# Patient Record
Sex: Female | Born: 1960 | Race: White | Hispanic: No | Marital: Married | Smoking: Never smoker
Health system: Southern US, Community
[De-identification: ages and names within clinical notes are randomized; demographics above are authoritative.]

## PROBLEM LIST (undated history)

## (undated) DIAGNOSIS — R000 Tachycardia, unspecified: Secondary | ICD-10-CM

## (undated) DIAGNOSIS — T4145XA Adverse effect of unspecified anesthetic, initial encounter: Secondary | ICD-10-CM

## (undated) DIAGNOSIS — E785 Hyperlipidemia, unspecified: Secondary | ICD-10-CM

## (undated) DIAGNOSIS — Z8489 Family history of other specified conditions: Secondary | ICD-10-CM

## (undated) DIAGNOSIS — H8109 Meniere's disease, unspecified ear: Secondary | ICD-10-CM

## (undated) DIAGNOSIS — K219 Gastro-esophageal reflux disease without esophagitis: Secondary | ICD-10-CM

## (undated) DIAGNOSIS — R112 Nausea with vomiting, unspecified: Secondary | ICD-10-CM

## (undated) DIAGNOSIS — G43909 Migraine, unspecified, not intractable, without status migrainosus: Secondary | ICD-10-CM

## (undated) DIAGNOSIS — C50919 Malignant neoplasm of unspecified site of unspecified female breast: Secondary | ICD-10-CM

## (undated) DIAGNOSIS — D649 Anemia, unspecified: Secondary | ICD-10-CM

## (undated) DIAGNOSIS — J45909 Unspecified asthma, uncomplicated: Secondary | ICD-10-CM

## (undated) DIAGNOSIS — T8859XA Other complications of anesthesia, initial encounter: Secondary | ICD-10-CM

## (undated) DIAGNOSIS — Z9889 Other specified postprocedural states: Secondary | ICD-10-CM

## (undated) HISTORY — PX: COLONOSCOPY: SHX174

## (undated) HISTORY — DX: Hyperlipidemia, unspecified: E78.5

## (undated) HISTORY — PX: COLPOSCOPY: SHX161

---
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
## 2010-07-16 ENCOUNTER — Emergency Department (HOSPITAL_BASED_OUTPATIENT_CLINIC_OR_DEPARTMENT_OTHER)
Admission: EM | Admit: 2010-07-16 | Discharge: 2010-07-16 | Disposition: A | Discharge status: Home / Self Care | Payer: 59 | Attending: Emergency Medicine | Admitting: Emergency Medicine

## 2010-07-16 DIAGNOSIS — M549 Dorsalgia, unspecified: Secondary | ICD-10-CM | POA: Insufficient documentation

## 2010-07-16 DIAGNOSIS — K219 Gastro-esophageal reflux disease without esophagitis: Secondary | ICD-10-CM | POA: Insufficient documentation

## 2018-01-27 ENCOUNTER — Other Ambulatory Visit: Payer: Self-pay | Admitting: Obstetrics and Gynecology

## 2018-01-27 DIAGNOSIS — R921 Mammographic calcification found on diagnostic imaging of breast: Secondary | ICD-10-CM

## 2018-02-03 ENCOUNTER — Ambulatory Visit
Admission: RE | Admit: 2018-02-03 | Discharge: 2018-02-03 | Disposition: A | Discharge status: Home / Self Care | Payer: 59 | Source: Ambulatory Visit | Attending: Obstetrics and Gynecology | Admitting: Obstetrics and Gynecology

## 2018-02-03 DIAGNOSIS — R921 Mammographic calcification found on diagnostic imaging of breast: Secondary | ICD-10-CM

## 2018-02-03 HISTORY — PX: BREAST BIOPSY: SHX20

## 2018-02-17 DIAGNOSIS — C50412 Malignant neoplasm of upper-outer quadrant of left female breast: Secondary | ICD-10-CM | POA: Insufficient documentation

## 2018-02-18 ENCOUNTER — Other Ambulatory Visit: Payer: Self-pay | Admitting: General Surgery

## 2018-02-18 DIAGNOSIS — D0512 Intraductal carcinoma in situ of left breast: Secondary | ICD-10-CM

## 2018-02-19 ENCOUNTER — Other Ambulatory Visit: Payer: Self-pay | Admitting: General Surgery

## 2018-02-19 DIAGNOSIS — D0512 Intraductal carcinoma in situ of left breast: Secondary | ICD-10-CM

## 2018-03-14 ENCOUNTER — Other Ambulatory Visit: Payer: Self-pay | Admitting: General Surgery

## 2018-03-14 DIAGNOSIS — C50011 Malignant neoplasm of nipple and areola, right female breast: Secondary | ICD-10-CM

## 2018-03-21 NOTE — Pre-Procedure Instructions (Signed)
Leylany Nored  03/21/2018      [id number] - [place], [place] - [address] AT [place] [address] Phone: [phone] Fax: [phone]    Your procedure is scheduled on Mon., Jan. 6, 2020 from 10:00AM-11:00AM  Report to [facility] Admitting Entrance "A" at 8:00AM  Call this number if you have problems the morning of surgery:  [phone]   Remember:  Do not eat after midnight on Jan. 5th  You may drink clear liquids until 3 hours (7:00AM) prior to surgery.  Clear liquids allowed are:  Water, Juice (non-citric and without pulp), Carbonated beverages, Clear Tea, Black Coffee only, Plain Jell-O only, Gatorade and Plain Popsicles only   Please complete your PRE-SURGERY ENSURE that was provided to you by (7:00AM) the morning of surgery.  Please, if able, drink it in one setting. DO NOT SIP.     Take these medicines the morning of surgery with A SIP OF WATER: Famotidine (PEPCID)  If needed: Loratadine (CLARITIN), Systane eye drops, and Ocean nasal spray  As of today, stop taking all Other Aspirin Products, Vitamins, Fish oils, and Herbal medications. Also stop all NSAIDS i.e. Advil, Ibuprofen, Motrin, Aleve, Anaprox, Naproxen, BC, Goody Powders, and all Supplements. Including: Chlorphen-Phenyleph-ASA (ALKA-SELTZER PLUS COLD    Do not wear jewelry, make-up or nail polish.  Do not wear lotions, powders, or perfumes, or deodorant.  Do not shave 48 hours prior to surgery.    Do not bring valuables to the hospital.  [facility] is not responsible for any belongings or valuables.  Contacts, dentures or bridgework may not be worn into surgery.  Leave your suitcase in the car.  After surgery it may be brought to your room.  For patients admitted to the hospital, discharge time will be determined by your treatment team.  Patients discharged the day of surgery will not be allowed to drive home.   Special  instructions:  [place]- Preparing For Surgery  Before surgery, you can play an important role. Because skin is not sterile, your skin needs to be as free of germs as possible. You can reduce the number of germs on your skin by washing with CHG (chlorahexidine gluconate) Soap before surgery.  CHG is an antiseptic cleaner which kills germs and bonds with the skin to continue killing germs even after washing.    Oral Hygiene is also important to reduce your risk of infection.  Remember - BRUSH YOUR TEETH THE MORNING OF SURGERY WITH YOUR REGULAR TOOTHPASTE  Please do not use if you have an allergy to CHG or antibacterial soaps. If your skin becomes reddened/irritated stop using the CHG.  Do not shave (including legs and underarms) for at least 48 hours prior to first CHG shower. It is OK to shave your face.  Please follow these instructions carefully.   1. Shower the NIGHT BEFORE SURGERY and the MORNING OF SURGERY with CHG.   2. If you chose to wash your hair, wash your hair first as usual with your normal shampoo.  3. After you shampoo, rinse your hair and body thoroughly to remove the shampoo.  4. Use CHG as you would any other liquid soap. You can apply CHG directly to the skin and wash gently with a scrungie or a clean washcloth.   5. Apply the CHG Soap to your body ONLY FROM THE NECK DOWN.  Do not use on open wounds or open sores.  Avoid contact with your eyes, ears, mouth and genitals (private parts). Wash Face and genitals (private parts)  with your normal soap.  6. Wash thoroughly, paying special attention to the area where your surgery will be performed.  7. Thoroughly rinse your body with warm water from the neck down.  8. DO NOT shower/wash with your normal soap after using and rinsing off the CHG Soap.  9. Pat yourself dry with a CLEAN TOWEL.  10. Wear CLEAN PAJAMAS to bed the night before surgery, wear comfortable clothes the morning of surgery  11. Place CLEAN SHEETS on  your bed the night of your first shower and DO NOT SLEEP WITH PETS.  Day of Surgery:  Do not apply any deodorants/lotions.  Please wear clean clothes to the hospital/surgery center.   Remember to brush your teeth WITH YOUR REGULAR TOOTHPASTE.  Please read over the following fact sheets that you were given. Pain Booklet, Coughing and Deep Breathing and Surgical Site Infection Prevention

## 2018-03-24 ENCOUNTER — Encounter (HOSPITAL_COMMUNITY): Payer: Self-pay

## 2018-03-24 ENCOUNTER — Encounter (HOSPITAL_COMMUNITY)
Admission: RE | Admit: 2018-03-24 | Discharge: 2018-03-24 | Disposition: A | Discharge status: Home / Self Care | Payer: 59 | Source: Ambulatory Visit | Attending: General Surgery | Admitting: General Surgery

## 2018-03-24 ENCOUNTER — Other Ambulatory Visit: Payer: Self-pay

## 2018-03-24 DIAGNOSIS — Z01812 Encounter for preprocedural laboratory examination: Secondary | ICD-10-CM | POA: Insufficient documentation

## 2018-03-24 HISTORY — DX: Other specified postprocedural states: Z98.890

## 2018-03-24 HISTORY — DX: Malignant neoplasm of unspecified site of unspecified female breast: C50.919

## 2018-03-24 HISTORY — DX: Anemia, unspecified: D64.9

## 2018-03-24 HISTORY — DX: Tachycardia, unspecified: R00.0

## 2018-03-24 HISTORY — DX: Gastro-esophageal reflux disease without esophagitis: K21.9

## 2018-03-24 HISTORY — DX: Migraine, unspecified, not intractable, without status migrainosus: G43.909

## 2018-03-24 HISTORY — DX: Nausea with vomiting, unspecified: R11.2

## 2018-03-24 HISTORY — DX: Adverse effect of unspecified anesthetic, initial encounter: T41.45XA

## 2018-03-24 HISTORY — DX: Other complications of anesthesia, initial encounter: T88.59XA

## 2018-03-24 HISTORY — DX: Unspecified asthma, uncomplicated: J45.909

## 2018-03-24 HISTORY — DX: Meniere's disease, unspecified ear: H81.09

## 2018-03-24 HISTORY — DX: Family history of other specified conditions: Z84.89

## 2018-03-24 LAB — BASIC METABOLIC PANEL
Anion gap: 12 (ref 5–15)
BUN: 12 mg/dL (ref 6–20)
CO2: 27 mmol/L (ref 22–32)
Calcium: 9.6 mg/dL (ref 8.9–10.3)
Chloride: 97 mmol/L — ABNORMAL LOW (ref 98–111)
Creatinine, Ser: 0.71 mg/dL (ref 0.44–1.00)
GFR calc Af Amer: >60 mL/min (ref >60)
GFR calc non Af Amer: >60 mL/min (ref >60)
Glucose, Bld: 69 mg/dL — ABNORMAL LOW (ref 70–99)
Potassium: 3.4 mmol/L — ABNORMAL LOW (ref 3.5–5.1)
SODIUM: 136 mmol/L (ref 135–145)

## 2018-03-24 LAB — CBC
HCT: 41.3 % (ref 36.0–46.0)
Hemoglobin: 13.2 g/dL (ref 12.0–15.0)
MCH: 28.9 pg (ref 26.0–34.0)
MCHC: 32 g/dL (ref 30.0–36.0)
MCV: 90.4 fL (ref 80.0–100.0)
NRBC: 0 % (ref 0.0–0.2)
Platelets: 306 10*3/uL (ref 150–400)
RBC: 4.57 MIL/uL (ref 3.87–5.11)
RDW: 12.8 % (ref 11.5–15.5)
WBC: 8.5 10*3/uL (ref 4.0–10.5)

## 2018-03-24 NOTE — Progress Notes (Addendum)
PCP/GYN - Dr. Shelly Flatten  Cardiologist - Denies  Chest x-ray - Denies  EKG - Denies  Stress Test -Denies   ECHO - Denies  Cardiac Cath - Denies  AICD- na PM- na LOOP- na  Sleep Study - Denies CPAP - None  LABS- 03/24/18: CBC, BMP 03/31/18: PT, per note on chart  ASA- Denies   Anesthesia- NoJeneen Rinks, PA for anesthesia services came to speak with the pt at the PAT appointment, due to pt having questions about anesthesia and alzheimer's correlation, since it runs in her family.  Pt denies having chest pain, sob, or fever at this time. All instructions explained to the pt, with a verbal understanding of the material. Pt agrees to go over the instructions while at home for a better understanding. The opportunity to ask questions was provided.

## 2018-03-28 ENCOUNTER — Ambulatory Visit
Admission: RE | Admit: 2018-03-28 | Discharge: 2018-03-28 | Disposition: A | Discharge status: Home / Self Care | Payer: 59 | Source: Ambulatory Visit | Attending: General Surgery | Admitting: General Surgery

## 2018-03-28 DIAGNOSIS — D0512 Intraductal carcinoma in situ of left breast: Secondary | ICD-10-CM

## 2018-03-31 ENCOUNTER — Ambulatory Visit (HOSPITAL_COMMUNITY): Payer: 59 | Admitting: Physician Assistant

## 2018-03-31 ENCOUNTER — Other Ambulatory Visit: Payer: Self-pay

## 2018-03-31 ENCOUNTER — Encounter (HOSPITAL_COMMUNITY)
Admission: RE | Disposition: A | Discharge status: Home / Self Care | Payer: Self-pay | Source: Home / Self Care | Attending: General Surgery

## 2018-03-31 ENCOUNTER — Ambulatory Visit (HOSPITAL_COMMUNITY)
Admission: RE | Admit: 2018-03-31 | Discharge: 2018-03-31 | Disposition: A | Discharge status: Home / Self Care | Payer: 59 | Attending: General Surgery | Admitting: General Surgery

## 2018-03-31 ENCOUNTER — Ambulatory Visit (HOSPITAL_COMMUNITY): Payer: 59 | Admitting: Certified Registered Nurse Anesthetist

## 2018-03-31 ENCOUNTER — Encounter (HOSPITAL_COMMUNITY): Payer: Self-pay | Admitting: *Deleted

## 2018-03-31 ENCOUNTER — Ambulatory Visit
Admission: RE | Admit: 2018-03-31 | Discharge: 2018-03-31 | Disposition: A | Discharge status: Home / Self Care | Payer: 59 | Source: Ambulatory Visit | Attending: General Surgery | Admitting: General Surgery

## 2018-03-31 DIAGNOSIS — D0592 Unspecified type of carcinoma in situ of left breast: Secondary | ICD-10-CM | POA: Diagnosis not present

## 2018-03-31 DIAGNOSIS — D0512 Intraductal carcinoma in situ of left breast: Secondary | ICD-10-CM

## 2018-03-31 DIAGNOSIS — Z170 Estrogen receptor positive status [ER+]: Secondary | ICD-10-CM | POA: Insufficient documentation

## 2018-03-31 DIAGNOSIS — Z79899 Other long term (current) drug therapy: Secondary | ICD-10-CM | POA: Diagnosis not present

## 2018-03-31 DIAGNOSIS — K219 Gastro-esophageal reflux disease without esophagitis: Secondary | ICD-10-CM | POA: Diagnosis not present

## 2018-03-31 HISTORY — PX: BREAST LUMPECTOMY: SHX2

## 2018-03-31 HISTORY — PX: BREAST LUMPECTOMY WITH RADIOACTIVE SEED LOCALIZATION: SHX6424

## 2018-03-31 LAB — PROTIME-INR
INR: 1
Prothrombin Time: 13.1 seconds (ref 11.4–15.2)

## 2018-03-31 SURGERY — BREAST LUMPECTOMY WITH RADIOACTIVE SEED LOCALIZATION
Anesthesia: General | Site: Breast | Laterality: Left

## 2018-03-31 MED ORDER — OXYCODONE HCL 5 MG/5ML PO SOLN: 5.0000 mg | Freq: Once | ORAL | Status: DC | PRN

## 2018-03-31 MED ORDER — OXYCODONE HCL 5 MG PO TABS: 5.0000 mg | ORAL_TABLET | ORAL | Status: DC | PRN

## 2018-03-31 MED ORDER — PROPOFOL 500 MG/50ML IV EMUL
INTRAVENOUS | Status: DC | PRN
  Administered 2018-03-31: 125 ug/kg/min via INTRAVENOUS

## 2018-03-31 MED ORDER — SODIUM CHLORIDE 0.9 % IV SOLN
INTRAVENOUS | Status: DC | PRN
  Administered 2018-03-31: 25 ug/min via INTRAVENOUS

## 2018-03-31 MED ORDER — METHYLENE BLUE 0.5 % INJ SOLN
INTRAVENOUS | Status: AC
  Filled 2018-03-31: qty 10

## 2018-03-31 MED ORDER — SODIUM CHLORIDE 0.9 % IV SOLN: INTRAVENOUS | Status: DC

## 2018-03-31 MED ORDER — LIDOCAINE 2% (20 MG/ML) 5 ML SYRINGE
INTRAMUSCULAR | Status: DC | PRN
  Administered 2018-03-31: 80 mg via INTRAVENOUS

## 2018-03-31 MED ORDER — PROPOFOL 10 MG/ML IV BOLUS
INTRAVENOUS | Status: DC | PRN
  Administered 2018-03-31: 160 mg via INTRAVENOUS

## 2018-03-31 MED ORDER — 0.9 % SODIUM CHLORIDE (POUR BTL) OPTIME
TOPICAL | Status: DC | PRN
  Administered 2018-03-31: 1000 mL

## 2018-03-31 MED ORDER — TRAMADOL HCL 50 MG PO TABS: 50.0000 mg | ORAL_TABLET | Freq: Four times a day (QID) | ORAL | 0 refills | Status: DC | PRN

## 2018-03-31 MED ORDER — LACTATED RINGERS IV SOLN
INTRAVENOUS | Status: DC
  Administered 2018-03-31: 10:00:00 via INTRAVENOUS

## 2018-03-31 MED ORDER — GABAPENTIN 100 MG PO CAPS
100.0000 mg | ORAL_CAPSULE | ORAL | Status: AC
  Administered 2018-03-31: 100 mg via ORAL
  Filled 2018-03-31: qty 1

## 2018-03-31 MED ORDER — SODIUM CHLORIDE (PF) 0.9 % IJ SOLN
INTRAMUSCULAR | Status: AC
  Filled 2018-03-31: qty 10

## 2018-03-31 MED ORDER — ACETAMINOPHEN 500 MG PO TABS: 1000.0000 mg | ORAL_TABLET | Freq: Once | ORAL | Status: DC | PRN

## 2018-03-31 MED ORDER — CEFAZOLIN SODIUM-DEXTROSE 2-4 GM/100ML-% IV SOLN
2.0000 g | INTRAVENOUS | Status: AC
  Administered 2018-03-31: 2 g via INTRAVENOUS
  Filled 2018-03-31: qty 100

## 2018-03-31 MED ORDER — BUPIVACAINE-EPINEPHRINE 0.25% -1:200000 IJ SOLN
INTRAMUSCULAR | Status: DC | PRN
  Administered 2018-03-31: 20 mL

## 2018-03-31 MED ORDER — FENTANYL CITRATE (PF) 100 MCG/2ML IJ SOLN
INTRAMUSCULAR | Status: DC | PRN
  Administered 2018-03-31 (×3): 50 ug via INTRAVENOUS

## 2018-03-31 MED ORDER — ONDANSETRON HCL 4 MG/2ML IJ SOLN
INTRAMUSCULAR | Status: DC | PRN
  Administered 2018-03-31: 4 mg via INTRAVENOUS

## 2018-03-31 MED ORDER — KETOROLAC TROMETHAMINE 15 MG/ML IJ SOLN
15.0000 mg | INTRAMUSCULAR | Status: AC
  Administered 2018-03-31: 15 mg via INTRAVENOUS
  Filled 2018-03-31: qty 1

## 2018-03-31 MED ORDER — PHENYLEPHRINE 40 MCG/ML (10ML) SYRINGE FOR IV PUSH (FOR BLOOD PRESSURE SUPPORT)
PREFILLED_SYRINGE | INTRAVENOUS | Status: DC | PRN
  Administered 2018-03-31: 120 ug via INTRAVENOUS
  Administered 2018-03-31: 80 ug via INTRAVENOUS

## 2018-03-31 MED ORDER — ACETAMINOPHEN 500 MG PO TABS
1000.0000 mg | ORAL_TABLET | ORAL | Status: AC
  Administered 2018-03-31: 1000 mg via ORAL
  Filled 2018-03-31: qty 2

## 2018-03-31 MED ORDER — MIDAZOLAM HCL 5 MG/5ML IJ SOLN
INTRAMUSCULAR | Status: DC | PRN
  Administered 2018-03-31: 2 mg via INTRAVENOUS

## 2018-03-31 MED ORDER — OXYCODONE HCL 5 MG PO TABS: 5.0000 mg | ORAL_TABLET | Freq: Once | ORAL | Status: DC | PRN

## 2018-03-31 MED ORDER — ACETAMINOPHEN 160 MG/5ML PO SOLN: 1000.0000 mg | Freq: Once | ORAL | Status: DC | PRN

## 2018-03-31 MED ORDER — GABAPENTIN 300 MG PO CAPS
ORAL_CAPSULE | ORAL | Status: AC
  Filled 2018-03-31: qty 1

## 2018-03-31 MED ORDER — ENSURE PRE-SURGERY PO LIQD: 296.0000 mL | Freq: Once | ORAL | Status: DC

## 2018-03-31 MED ORDER — FENTANYL CITRATE (PF) 100 MCG/2ML IJ SOLN: 25.0000 ug | INTRAMUSCULAR | Status: DC | PRN

## 2018-03-31 MED ORDER — ACETAMINOPHEN 10 MG/ML IV SOLN: 1000.0000 mg | Freq: Once | INTRAVENOUS | Status: DC | PRN

## 2018-03-31 MED ORDER — BUPIVACAINE-EPINEPHRINE (PF) 0.25% -1:200000 IJ SOLN
INTRAMUSCULAR | Status: AC
  Filled 2018-03-31: qty 30

## 2018-03-31 MED ORDER — DEXAMETHASONE SODIUM PHOSPHATE 10 MG/ML IJ SOLN
INTRAMUSCULAR | Status: DC | PRN
  Administered 2018-03-31: 10 mg via INTRAVENOUS

## 2018-03-31 MED ORDER — MIDAZOLAM HCL 2 MG/2ML IJ SOLN
INTRAMUSCULAR | Status: AC
  Filled 2018-03-31: qty 2

## 2018-03-31 MED ORDER — FENTANYL CITRATE (PF) 250 MCG/5ML IJ SOLN
INTRAMUSCULAR | Status: AC
  Filled 2018-03-31: qty 5

## 2018-03-31 SURGICAL SUPPLY — 54 items
ADH SKN CLS APL DERMABOND .7 (GAUZE/BANDAGES/DRESSINGS) ×1
APPLIER CLIP 9.375 MED OPEN (MISCELLANEOUS) ×3
APR CLP MED 9.3 20 MLT OPN (MISCELLANEOUS) ×1
BINDER BREAST LRG (GAUZE/BANDAGES/DRESSINGS) IMPLANT
BINDER BREAST XLRG (GAUZE/BANDAGES/DRESSINGS) IMPLANT
BLADE SURG 15 STRL LF DISP TIS (BLADE) ×1 IMPLANT
BLADE SURG 15 STRL SS (BLADE) ×3
CANISTER SUCT 3000ML PPV (MISCELLANEOUS) ×3 IMPLANT
CHLORAPREP W/TINT 26ML (MISCELLANEOUS) ×3 IMPLANT
CLIP APPLIE 9.375 MED OPEN (MISCELLANEOUS) IMPLANT
CLIP VESOCCLUDE MED 6/CT (CLIP) ×1 IMPLANT
CLOSURE STERI-STRIP 1/2X4 (GAUZE/BANDAGES/DRESSINGS) ×1
CLOSURE WOUND 1/2 X4 (GAUZE/BANDAGES/DRESSINGS) ×1
CLSR STERI-STRIP ANTIMIC 1/2X4 (GAUZE/BANDAGES/DRESSINGS) ×1 IMPLANT
COVER PROBE W GEL 5X96 (DRAPES) ×3 IMPLANT
COVER SURGICAL LIGHT HANDLE (MISCELLANEOUS) ×3 IMPLANT
COVER WAND RF STERILE (DRAPES) ×3 IMPLANT
DERMABOND ADVANCED (GAUZE/BANDAGES/DRESSINGS) ×2
DERMABOND ADVANCED .7 DNX12 (GAUZE/BANDAGES/DRESSINGS) ×1 IMPLANT
DEVICE DUBIN SPECIMEN MAMMOGRA (MISCELLANEOUS) ×3 IMPLANT
DRAPE CHEST BREAST 15X10 FENES (DRAPES) ×3 IMPLANT
DRAPE UTILITY XL STRL (DRAPES) ×3 IMPLANT
ELECT COATED BLADE 2.86 ST (ELECTRODE) ×3 IMPLANT
ELECT REM PT RETURN 9FT ADLT (ELECTROSURGICAL) ×3
ELECTRODE REM PT RTRN 9FT ADLT (ELECTROSURGICAL) ×1 IMPLANT
GLOVE BIO SURGEON STRL SZ7 (GLOVE) ×6 IMPLANT
GLOVE BIOGEL PI IND STRL 7.5 (GLOVE) ×1 IMPLANT
GLOVE BIOGEL PI INDICATOR 7.5 (GLOVE) ×2
GOWN STRL REUS W/ TWL LRG LVL3 (GOWN DISPOSABLE) ×2 IMPLANT
GOWN STRL REUS W/TWL LRG LVL3 (GOWN DISPOSABLE) ×6
KIT BASIN OR (CUSTOM PROCEDURE TRAY) ×3 IMPLANT
KIT MARKER MARGIN INK (KITS) ×3 IMPLANT
LIGHT WAVEGUIDE WIDE FLAT (MISCELLANEOUS) IMPLANT
NDL HYPO 25GX1X1/2 BEV (NEEDLE) ×1 IMPLANT
NEEDLE HYPO 25GX1X1/2 BEV (NEEDLE) ×3 IMPLANT
NS IRRIG 1000ML POUR BTL (IV SOLUTION) ×3 IMPLANT
PACK SURGICAL SETUP 50X90 (CUSTOM PROCEDURE TRAY) ×3 IMPLANT
PENCIL BUTTON HOLSTER BLD 10FT (ELECTRODE) ×3 IMPLANT
SPONGE LAP 18X18 X RAY DECT (DISPOSABLE) ×3 IMPLANT
STRIP CLOSURE SKIN 1/2X4 (GAUZE/BANDAGES/DRESSINGS) ×2 IMPLANT
SUT MNCRL AB 4-0 PS2 18 (SUTURE) ×3 IMPLANT
SUT MON AB 5-0 PS2 18 (SUTURE) ×2 IMPLANT
SUT SILK 2 0 SH (SUTURE) IMPLANT
SUT VIC AB 2-0 SH 27 (SUTURE) ×3
SUT VIC AB 2-0 SH 27XBRD (SUTURE) ×1 IMPLANT
SUT VIC AB 3-0 SH 27 (SUTURE) ×3
SUT VIC AB 3-0 SH 27X BRD (SUTURE) ×1 IMPLANT
SYR BULB 3OZ (MISCELLANEOUS) ×3 IMPLANT
SYR CONTROL 10ML LL (SYRINGE) ×3 IMPLANT
TOWEL OR 17X24 6PK STRL BLUE (TOWEL DISPOSABLE) ×3 IMPLANT
TOWEL OR 17X26 10 PK STRL BLUE (TOWEL DISPOSABLE) ×3 IMPLANT
TUBE CONNECTING 12'X1/4 (SUCTIONS) ×1
TUBE CONNECTING 12X1/4 (SUCTIONS) ×2 IMPLANT
YANKAUER SUCT BULB TIP NO VENT (SUCTIONS) ×3 IMPLANT

## 2018-03-31 NOTE — H&P (Signed)
72 yof referred by Dr Shelly Flatten for breast cancer. she has no fh and no prior history. she had no mass or dc. she had screening mm that shows b density breasts. the right is negative. the left side has a mass with calcifications. callback shows a 1.8x2.1x0.7 cm area of calcificiations. US shows a 1 cm area. [place] axilla is negative. 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 she is here with her sister and daughter to discuss options   Past Surgical History  Breast Biopsy  Left. Cesarean Section - 1  Tonsillectomy   Diagnostic Studies History Colonoscopy  5-10 years ago Mammogram  within last year Pap Smear  1-5 years ago  Allergies  Shellfish  Nausea, Vomiting. Allergies Reconciled   Medication History  Chlorthalidone (25MG  Tablet, Oral) Active. Potassium Chloride ER (10MEQ Capsule ER, Oral) Active. Nitrofurantoin Monohyd Macro (100MG  Capsule, Oral) Active. Klor-Con M10 (10MEQ Tablet ER, Oral) Active. Cefuroxime Axetil (250MG  Tablet, Oral) Active. Allegra Allergy (60MG  Tablet, Oral) Active. Medications Reconciled  Social History Alcohol use  Occasional alcohol use. Caffeine use  Coffee, Tea. No drug use  Tobacco use  Never smoker.  Family History  Alcohol Abuse  Daughter. Depression  Brother, Mother, Sister. Heart Disease  Father, Mother. Hypertension  Mother. Thyroid problems  Mother, Sister. SVT (SUPRAVENTRICULAR TACHYCARDIA) (I47.1)  Daughter.  Pregnancy / Birth History Age at menarche  13 years. Age of menopause  2-60 Contraceptive History  Oral contraceptives. Gravida  2 Irregular periods  Length (months) of breastfeeding  7-12 Maternal age  46-25 Para  2  Other Problems  Back Pain  Gastroesophageal Reflux Disease  Lump In Breast  Migraine Headache  Other disease, cancer, significant illness   Review of Systems General Not Present- Appetite Loss, Chills, Fatigue, Fever, Night Sweats, Weight Gain and  Weight Loss. Skin Present- Dryness. Not Present- Change in Wart/Mole, Hives, Jaundice, New Lesions, Non-Healing Wounds, Rash and Ulcer. HEENT Present- Ringing in the Ears, Sore Throat and Wears glasses/contact lenses. Not Present- Earache, Hearing Loss, Hoarseness, Nose Bleed, Oral Ulcers, Seasonal Allergies, Sinus Pain, Visual Disturbances and Yellow Eyes. Respiratory Not Present- Bloody sputum, Chronic Cough, Difficulty Breathing, Snoring and Wheezing. Breast Present- Breast Mass. Not Present- Breast Pain, Nipple Discharge and Skin Changes. Cardiovascular Not Present- Chest Pain, Difficulty Breathing Lying Down, Leg Cramps, Palpitations, Rapid Heart Rate, Shortness of Breath and Swelling of Extremities. Gastrointestinal Not Present- Abdominal Pain, Bloating, Bloody Stool, Change in Bowel Habits, Chronic diarrhea, Constipation, Difficulty Swallowing, Excessive gas, Gets full quickly at meals, Hemorrhoids, Indigestion, Nausea, Rectal Pain and Vomiting. Female Genitourinary Not Present- Frequency, Nocturia, Painful Urination, Pelvic Pain and Urgency. Musculoskeletal Not Present- Back Pain, Joint Pain, Joint Stiffness, Muscle Pain, Muscle Weakness and Swelling of Extremities. Neurological Not Present- Decreased Memory, Fainting, Headaches, Numbness, Seizures, Tingling, Tremor, Trouble walking and Weakness. Psychiatric Not Present- Anxiety, Bipolar, Change in Sleep Pattern, Depression, Fearful and Frequent crying. Endocrine Not Present- Cold Intolerance, Excessive Hunger, Hair Changes, Heat Intolerance, Hot flashes and New Diabetes. Hematology Not Present- Blood Thinners, Easy Bruising, Excessive bleeding, Gland problems, HIV and Persistent Infections.   Physical Exam  General Mental Status-Alert. Eye Sclera/Conjunctiva - Bilateral-No scleral icterus. Chest and Lung Exam Chest and lung exam reveals -quiet, even and easy respiratory effort with no use of accessory muscles and on auscultation,  normal breath sounds, no adventitious sounds and normal vocal resonance. Breast Nipples-No Discharge. Breast Lump-No Palpable Breast Mass. Cardiovascular Cardiovascular examination reveals -normal heart sounds, regular rate and rhythm with no murmurs.  Abdomen Note: soft nt Neurologic Neurologic evaluation reveals -alert and oriented x 3 with no impairment of recent or remote memory. Lymphatic Head & Neck General Head & Neck Lymphatics: Bilateral - Description - Normal. Axillary General Axillary Region: Bilateral - Description - Normal. Note: no Radium adenopathy   Assessment & Plan  DUCTAL CARCINOMA IN SITU (DCIS) OF LEFT BREAST (D05.12) Story: Left seed guided lumpectomy We discussed the staging and pathophysiology of breast cancer. We discussed all of the different options for treatment for breast cancer including surgery, chemotherapy, radiation therapy, Herceptin, and antiestrogen therapy. I dont think she needs genetics and dont think she needs mri I dont think if we do lumpectomy that she will need a sentinel node biopsy. discussed that she might need to return for sn biopsy if invasive disease is found on excision. We discussed the options for treatment of the breast cancer which included lumpectomy versus a mastectomy. We discussed the performance of the lumpectomy with radioactive seed placement. We discussed a 5% chance of a positive margin requiring reexcision in the operating room. We also discussed that she will need radiation therapy if she undergoes lumpectomy. The breast cannot undergo more radiation therapy in the same breast after lumpectomy in the future. We discussed the mastectomy (removal of whole breast) and the postoperative care for that as well. Mastectomy can be followed by reconstruction. This is a more extensive surgery and requires more recovery. The decision for lumpectomy vs mastectomy has no impact on decision for chemotherapy. Most mastectomy patients  will not need radiation therapy. We discussed that there is no difference in her survival whether she undergoes lumpectomy with radiation therapy or antiestrogen therapy versus a mastectomy. There is also no real difference between her recurrence in the breast. We discussed the risks of operation including bleeding, infection, possible reoperation. She understands her further therapy will be based on what her stages at the time of her operation. she is going to see rad onc first to decide. she is going to let me know who she wants to see

## 2018-03-31 NOTE — Anesthesia Procedure Notes (Signed)
Procedure Name: LMA Insertion Date/Time: 03/31/2018 10:05 AM Performed by: Oleta Mouse, MD Pre-anesthesia Checklist: Patient identified, Suction available, Emergency Drugs available, Patient being monitored and Timeout performed Patient Re-evaluated:Patient Re-evaluated prior to induction Oxygen Delivery Method: Circle system utilized Preoxygenation: Pre-oxygenation with 100% oxygen Induction Type: IV induction LMA: LMA inserted LMA Size: 4.0 Number of attempts: 1 Placement Confirmation: positive ETCO2 and breath sounds checked- equal and bilateral Tube secured with: Tape Dental Injury: Teeth and Oropharynx as per pre-operative assessment  Comments: Patient has preexisting dark coloration on upper lip

## 2018-03-31 NOTE — Interval H&P Note (Signed)
History and Physical Interval Note:  03/31/2018 9:38 AM  Carolyn Ball  has presented today for surgery, with the diagnosis of left breast dcis  The various methods of treatment have been discussed with the patient and family. After consideration of risks, benefits and other options for treatment, the patient has consented to  Procedure(s): LEFT BREAST LUMPECTOMY WITH RADIOACTIVE SEED LOCALIZATION (Left) as a surgical intervention .  The patient's history has been reviewed, patient examined, no change in status, stable for surgery.  I have reviewed the patient's chart and labs.  Questions were answered to the patient's satisfaction.     Rolm Bookbinder

## 2018-03-31 NOTE — Anesthesia Preprocedure Evaluation (Addendum)
Anesthesia Evaluation  Patient identified by MRN, date of birth, ID band Patient awake    Reviewed: Allergy & Precautions, NPO status , Patient's Chart, lab work & pertinent test results  History of Anesthesia Complications (+) PONV and history of anesthetic complications  Airway Mallampati: I  TM Distance: >3 FB Neck ROM: Full    Dental  (+) Teeth Intact   Pulmonary asthma ,    breath sounds clear to auscultation       Cardiovascular negative cardio ROS   Rhythm:Regular     Neuro/Psych  Headaches, negative psych ROS   GI/Hepatic Neg liver ROS, GERD  Medicated and Controlled,  Endo/Other  negative endocrine ROS  Renal/GU negative Renal ROS     Musculoskeletal negative musculoskeletal ROS (+)   Abdominal   Peds  Hematology negative hematology ROS (+)   Anesthesia Other Findings   Reproductive/Obstetrics                            Anesthesia Physical Anesthesia Plan  ASA: II  Anesthesia Plan: General   Post-op Pain Management:    Induction: Intravenous  PONV Risk Score and Plan: 4 or greater and Ondansetron, Dexamethasone, Propofol infusion and TIVA  Airway Management Planned: LMA  Additional Equipment: None  Intra-op Plan:   Post-operative Plan: Extubation in OR  Informed Consent: I have reviewed the patients History and Physical, chart, labs and discussed the procedure including the risks, benefits and alternatives for the proposed anesthesia with the patient or authorized representative who has indicated his/her understanding and acceptance.   Dental advisory given  Plan Discussed with: CRNA and Surgeon  Anesthesia Plan Comments:         Anesthesia Quick Evaluation

## 2018-03-31 NOTE — Transfer of Care (Addendum)
Immediate Anesthesia Transfer of Care Note  Patient: Carolyn Ball  Procedure(s) Performed: LEFT BREAST LUMPECTOMY WITH RADIOACTIVE SEED LOCALIZATION (Left Breast)  Patient Location: PACU  Anesthesia Type:General  Level of Consciousness: awake, alert  and oriented  Airway & Oxygen Therapy: Patient Spontanous Breathing and Patient connected to nasal cannula oxygen  Post-op Assessment: Report given to RN and Post -op Vital signs reviewed and stable  Post vital signs: Reviewed and stable  Last Vitals:  Vitals Value Taken Time  BP 99/38   Temp    Pulse 69 03/31/2018 11:01 AM  Resp 14 03/31/2018 11:01 AM  SpO2 100 % 03/31/2018 11:01 AM  Vitals shown include unvalidated device data.  Last Pain:  Vitals:   03/31/18 0904  TempSrc:   PainSc: 0-No pain         Complications: No apparent anesthesia complications

## 2018-03-31 NOTE — Discharge Instructions (Signed)
[facility] °Office Phone Number [phone] ° °POST OP INSTRUCTIONS °Take 400 mg of ibuprofen every 8 hours or 650 mg tylenol every 6 hours for next 72 hours then as needed. Use ice several times daily also. °Always review your discharge instruction sheet given to you by the facility where your surgery was performed. ° °IF YOU HAVE DISABILITY OR FAMILY LEAVE FORMS, YOU MUST BRING THEM TO THE OFFICE FOR PROCESSING.  DO NOT GIVE THEM TO YOUR DOCTOR. ° °1. A prescription for pain medication may be given to you upon discharge.  Take your pain medication as prescribed, if needed.  If narcotic pain medicine is not needed, then you may take acetaminophen (Tylenol), naprosyn (Alleve) or ibuprofen (Advil) as needed. °2. Take your usually prescribed medications unless otherwise directed °3. If you need a refill on your pain medication, please contact your pharmacy.  They will contact our office to request authorization.  Prescriptions will not be filled after 5pm or on week-ends. °4. You should eat very light the first 24 hours after surgery, such as soup, crackers, pudding, etc.  Resume your normal diet the day after surgery. °5. Most patients will experience some swelling and bruising in the breast.  Ice packs and a good support bra will help.  Wear the breast binder provided or a sports bra for 72 hours day and night.  After that wear a sports bra during the day until you return to the office. Swelling and bruising can take several days to resolve.  °6. It is common to experience some constipation if taking pain medication after surgery.  Increasing fluid intake and taking a stool softener will usually help or prevent this problem from occurring.  A mild laxative (Milk of Magnesia or Miralax) should be taken according to package directions if there are no bowel movements after 48 hours. °7. Unless discharge instructions indicate otherwise, you may remove your bandages 48 hours after surgery and you may  shower at that time.  You may have steri-strips (small skin tapes) in place directly over the incision.  These strips should be left on the skin for 7-10 days and will come off on their own.  If your surgeon used skin glue on the incision, you may shower in 24 hours.  The glue will flake off over the next 2-3 weeks.  Any sutures or staples will be removed at the office during your follow-up visit. °8. ACTIVITIES:  You may resume regular daily activities (gradually increasing) beginning the next day.  Wearing a good support bra or sports bra minimizes pain and swelling.  You may have sexual intercourse when it is comfortable. °a. You may drive when you no longer are taking prescription pain medication, you can comfortably wear a seatbelt, and you can safely maneuver your car and apply brakes. °b. RETURN TO WORK:  ______________________________________________________________________________________ °9. You should see your doctor in the office for a follow-up appointment approximately two weeks after your surgery.  Your doctor’s nurse will typically make your follow-up appointment when she calls you with your pathology report.  Expect your pathology report 3-4 business days after your surgery.  You may call to check if you do not hear from us after three days. °10. OTHER INSTRUCTIONS: _______________________________________________________________________________________________ _____________________________________________________________________________________________________________________________________ °_____________________________________________________________________________________________________________________________________ °_____________________________________________________________________________________________________________________________________ ° °WHEN TO CALL DR Reighn Kaplan: °1. Fever over 101.0 °2. Nausea and/or vomiting. °3. Extreme swelling or bruising. °4. Continued bleeding from  incision. °5. Increased pain, redness, or drainage from the incision. ° °The clinic staff is available to   answer your questions during regular business hours.  Please don’t hesitate to call and ask to speak to one of the nurses for clinical concerns.  If you have a medical emergency, go to the nearest emergency room or call 911.  A surgeon from [facility] is always on call at the hospital. ° °For further questions, please visit [email or web address] ° °

## 2018-03-31 NOTE — Op Note (Signed)
Preoperative diagnosis: Left breast cancer, clinical stage 0 Postoperative diagnosis: Same as above Procedure: Left breast radioactive seed guided lumpectomy Surgeon: Dr. Serita Grammes Anesthesia: General Estimated blood loss: Minimal Drains: None Complications: None Specimens: 1.  Left breast tissue marked with paint 2.  Additional superior, lateral, posterior margins all marked short stitch superior, long stitch lateral, double stitch deep Special count was correct at completion Disposition to recovery stable condition  Indications: This is a 58 year old female with a 2.1 cm area on a left-sided mammogram that is undergone biopsy and is high-grade DCIS.  We discussed options and have elected to proceed with a lumpectomy.  She had a seed placed prior to beginning I had these mammograms available.  Procedure: After informed consent was obtained the patient was taken to the operating room.  I confirm placement of the seed prior to beginning.  SCDs were in place.  She was given antibiotics.  She was then prepped and draped in the standard sterile surgical fashion.  A surgical timeout was then performed.  I filtrated Marcaine in the superior portion of the breast.  I then made a periareolar incision in order to hide the scar.  I tunneled to the lesion.  I then used the neoprobe to remove the seed and the surrounding tissue with an attempt him to get a clear margin.  I then remove this and placed paint on it.  Mammogram confirmed removal of the clip and the seed as well as the calcifications.  I did take an additional superior, posterior, and lateral margin that were marked as above.  The posterior margin is now the muscle.  Clips were placed.  She was then closed with 2-0 Vicryl, 3-0 Vicryl, and 5-0 Monocryl.  Glue and Steri-Strips were applied.  She tolerated this well was extubated and transferred to recovery stable.

## 2018-04-01 ENCOUNTER — Encounter (HOSPITAL_COMMUNITY): Payer: Self-pay | Admitting: General Surgery

## 2018-04-01 NOTE — Anesthesia Postprocedure Evaluation (Signed)
Anesthesia Post Note  Patient: Carolyn Ball  Procedure(s) Performed: LEFT BREAST LUMPECTOMY WITH RADIOACTIVE SEED LOCALIZATION (Left Breast)     Patient location during evaluation: PACU Anesthesia Type: General Level of consciousness: awake and alert Pain management: pain level controlled Vital Signs Assessment: post-procedure vital signs reviewed and stable Respiratory status: spontaneous breathing, nonlabored ventilation, respiratory function stable and patient connected to nasal cannula oxygen Cardiovascular status: blood pressure returned to baseline and stable Postop Assessment: no apparent nausea or vomiting Anesthetic complications: no    Last Vitals:  Vitals:   03/31/18 1115 03/31/18 1120  BP: 100/60 (!) 111/58  Pulse: 72   Resp: 18   Temp: 36.7 C   SpO2: 100% 95%    Last Pain:  Vitals:   03/31/18 1120  TempSrc:   PainSc: 0-No pain                 Lenzie Montesano

## 2018-04-16 ENCOUNTER — Other Ambulatory Visit: Payer: Self-pay | Admitting: General Surgery

## 2018-05-25 DIAGNOSIS — Z923 Personal history of irradiation: Secondary | ICD-10-CM

## 2018-05-25 HISTORY — DX: Personal history of irradiation: Z92.3

## 2018-06-27 ENCOUNTER — Other Ambulatory Visit: Payer: Self-pay

## 2018-06-27 ENCOUNTER — Ambulatory Visit (INDEPENDENT_AMBULATORY_CARE_PROVIDER_SITE_OTHER): Payer: 59 | Admitting: Family Medicine

## 2018-06-27 ENCOUNTER — Encounter: Payer: Self-pay | Admitting: Family Medicine

## 2018-06-27 VITALS — Ht 64.0 in | Wt 143.0 lb

## 2018-06-27 DIAGNOSIS — G43809 Other migraine, not intractable, without status migrainosus: Secondary | ICD-10-CM

## 2018-06-27 DIAGNOSIS — C50412 Malignant neoplasm of upper-outer quadrant of left female breast: Secondary | ICD-10-CM

## 2018-06-27 DIAGNOSIS — Z7689 Persons encountering health services in other specified circumstances: Secondary | ICD-10-CM

## 2018-06-27 DIAGNOSIS — Z170 Estrogen receptor positive status [ER+]: Secondary | ICD-10-CM

## 2018-06-27 DIAGNOSIS — H8109 Meniere's disease, unspecified ear: Secondary | ICD-10-CM | POA: Diagnosis not present

## 2018-06-27 MED ORDER — SUMATRIPTAN SUCCINATE 50 MG PO TABS: 50.0000 mg | ORAL_TABLET | ORAL | 0 refills | Status: AC | PRN

## 2018-06-27 NOTE — Progress Notes (Signed)
Virtual Visit via Video   I connected with Carolyn Ball on 06/28/18 at 10:30 AM EDT by a video enabled telemedicine application and verified that I am speaking with the correct person using two identifiers. Location patient: Home Location provider: [facility], Office Persons participating in the virtual visit: Patient, Dr. Raoul Pitch and R.Baker, LPN  I discussed the limitations of evaluation and management by telemedicine and the availability of in person appointments. The patient expressed understanding and agreed to proceed.  Subjective:   Patient Care Team    Relationship Specialty Notifications Start End  Ma Hillock, DO PCP - General Family Medicine  06/27/18   Thea Silversmith, MD Referring Physician Radiation Oncology  06/27/18   Marcy Panning, MD Referring Physician Oncology  06/27/18   Lynder Parents., MD  Obstetrics and Gynecology  06/27/18   Rolm Bookbinder, MD Consulting Physician General Surgery  06/27/18   Thornell Sartorius, MD Consulting Physician Otolaryngology  06/28/18   Ramonita Lab, Jovita Kussmaul, MD Referring Physician Ophthalmology  06/28/18     Chief Complaint  Patient presents with  . Establish Care    Pt has history of migraines and started to get really bad again last year. Previous PCP Greta O'Busche PA, [facility] on [address]. GYN Dr Shelly Flatten, Last pap summer and mammorgram 2019. Has Breast Cancer and just finished tx    HPI: Patient presents today by her virtual visit to establish care and discuss her migraines.  She reports that she has about 1-2 migraines a year.  Particular migraine slowly started at the end of last week and has slightly hung on throughout this time.  She has tried NSAIDs for relief and nothing seems to improve her migraines.  At one point in time she was on Topamax but stopped that many years ago.  She does endorse having some sound and light sensitivity with her migraines.  She denies any nausea and vomiting with her  migraines.  She has a history of Mnire's disease and is on a diuretic this has been rather controlled for her.  She also has recently undergone breast cancer treatment with lumpectomy, radiation therapy and she is prescribed anastrozole.  She reports no new medications.  She denies any current ringing in her ears, dizziness, gait instability, difficulty swallowing or slurring her words, or unilateral weakness.  ROS: See pertinent positives and negatives per HPI.  Patient Active Problem List   Diagnosis Date Noted  . Meniere disease   . Migraine   . Hyperlipidemia   . Breast cancer of upper-outer quadrant of left female breast ([place]) 02/17/2018    Social History   Tobacco Use  . Smoking status: Never Smoker  . Smokeless tobacco: Never Used  Substance Use Topics  . Alcohol use: Yes    Comment: occasional    Current Outpatient Medications:  .  calcium carbonate (OSCAL) 1500 (600 Ca) MG TABS tablet, Take 1 tablet by mouth 2 (two) times daily with a meal. 2am and 2pm, Disp: , Rfl:  .  chlorthalidone (HYGROTON) 25 MG tablet, Take 1 tablet (25 mg total) by mouth daily., Disp: 30 tablet, Rfl: 5 .  famotidine (PEPCID) 20 MG tablet, Take 20 mg by mouth daily., Disp: , Rfl:  .  fexofenadine (ALLEGRA) 60 MG tablet, Take 60 mg by mouth daily., Disp: , Rfl:  .  KLOR-CON M10 10 MEQ tablet, Take 20 mEq by mouth daily., Disp: , Rfl: 1 .  NON FORMULARY, Apply 1 application  topically as needed. Essential oils- Lemongrass and Tension oil, Disp: , Rfl:  .  NON FORMULARY, 1 mg. Doterra Lifelong Vitality pack pt takes 2 times daily and is pills and oils, Disp: , Rfl:  .  Polyethyl Glycol-Propyl Glycol (SYSTANE ULTRA OP), Place 1 drop into both eyes 4 (four) times daily as needed (dry eyes)., Disp: , Rfl:  .  sodium chloride (OCEAN) 0.65 % SOLN nasal spray, Place 1 spray into both nostrils daily., Disp: , Rfl:  .  anastrozole (ARIMIDEX) 1 MG tablet, Take 1 mg by mouth daily., Disp: , Rfl:  .  SUMAtriptan  (IMITREX) 50 MG tablet, Take 1 tablet (50 mg total) by mouth every 2 (two) hours as needed for migraine. May repeat in 2 hours if headache persists or recurs., Disp: 10 tablet, Rfl: 0  Allergies  Allergen Reactions  . Adhesive [Tape]   . Latex Other (See Comments)    Welts on skin  . Shellfish Allergy Other (See Comments)    Intestinal Issues    Objective:  Ht 5\' 4"  (1.626 m)   Wt 143 lb (64.9 kg)   BMI 24.55 kg/m  Gen: No acute distress. Nontoxic in appearance.  HENT: AT. [place].  MMM.  Eyes: Conjunctiva without redness, discharge or icterus. Chest: Cough or shortness of breath not present.  Skin: no rashes, purpura or petechiae.  Neuro: Normal gait. Alert. Oriented x3  Psych: Normal affect, dress and demeanor. Normal speech. Normal thought content and judgment.   Assessment and Plan:  Carolyn Ball is a 58 y.o. female - present to establish.  Meniere's disease, unspecified laterality -Her ENT is retiring.  She will call us to let us know what ENT she would like to see and we will place a referral for her at that time without needing to see her back for that. -Continue chlorthalidone 25 mg daily.  Other migraine without status migrainosus, not intractable -Do not think a preventative medication such as Topamax would be beneficial to her at this time considering she averages 1-2 migraines a year. -Imitrex prescribed with instructions. -And also encouraged to try B12 supplementation daily and magnesium  -If Imitrex is working will refill yearly during physical. -If headaches are worsening or not improved with Imitrex then would need to have a face-to-face office encounter for further evaluation and consider imaging.  She did have a history of breast cancer recently treated with lumpectomy/radiation therapy and now on anastrozole.  Takes were present for years prior to cancer diagnosis.  Malignant neoplasm of upper-outer quadrant of left breast in female, estrogen receptor positive  ([place]) -Managed by oncology. S/P lumpectomy and radiation therapy.  On anastrozole.   Greater than 30 minutes spent with patient, >50% of time spent face to face counseling and  coordinating care.   Howard Pouch, DO 06/28/2018

## 2018-06-28 ENCOUNTER — Encounter: Payer: Self-pay | Admitting: Family Medicine

## 2018-06-28 DIAGNOSIS — G43909 Migraine, unspecified, not intractable, without status migrainosus: Secondary | ICD-10-CM | POA: Insufficient documentation

## 2018-06-28 DIAGNOSIS — H8109 Meniere's disease, unspecified ear: Secondary | ICD-10-CM | POA: Insufficient documentation

## 2018-06-28 DIAGNOSIS — E785 Hyperlipidemia, unspecified: Secondary | ICD-10-CM | POA: Insufficient documentation

## 2018-06-28 MED ORDER — CHLORTHALIDONE 25 MG PO TABS: 25.0000 mg | ORAL_TABLET | Freq: Every day | ORAL | 5 refills | Status: AC

## 2018-06-28 NOTE — Patient Instructions (Signed)
Pleasure to meet you today.  Prescribed Imitrex for migraines.  Try magnesium and B12 supplements as we discussed to also add in migraine prevention.  Schedule physical when due.    Please help [place] help you:  We are honored you have chosen [facility] for your Primary Care home. Below you will find basic instructions that you may need to access in the future. Please help [place] help you by reading the instructions, which cover many of the frequent questions we experience.   Prescription refills and request:  -In order to allow more efficient response time, please call your pharmacy for all refills. They will forward the request electronically to [place]. This allows for the quickest possible response. Request left on a nurse line can take longer to refill, since these are checked as time allows between office patients and other phone calls.  - refill request can take up to 3-5 working days to complete.  - If request is sent electronically and request is appropiate, it is usually completed in 1-2 business days.  - all patients will need to be seen routinely for all chronic medical conditions requiring prescription medications (see follow-up below). If you are overdue for follow up on your condition, you will be asked to make an appointment and we will call in enough medication to cover you until your appointment (up to 30 days).  - all controlled substances will require a face to face visit to request/refill.  - if you desire your prescriptions to go through a new pharmacy, and have an active script at original pharmacy, you will need to call your pharmacy and have scripts transferred to new pharmacy. This is completed between the pharmacy locations and not by your provider.    Results: If any images or labs were ordered, it can take up to 1 week to get results depending on the test ordered and the lab/facility running and resulting the test. - Normal or stable results, which do not need further  discussion, may be released to your mychart immediately with attached note to you. A call may not be generated for normal results. Please make certain to sign up for mychart. If you have questions on how to activate your mychart you can call the front office.  - If your results need further discussion, our office will attempt to contact you via phone, and if unable to reach you after 2 attempts, we will release your abnormal result to your mychart with instructions.  - All results will be automatically released in mychart after 1 week.  - Your provider will provide you with explanation and instruction on all relevant material in your results. Please keep in mind, results and labs may appear confusing or abnormal to the untrained eye, but it does not mean they are actually abnormal for you personally. If you have any questions about your results that are not covered, or you desire more detailed explanation than what was provided, you should make an appointment with your provider to do so.   Our office handles many outgoing and incoming calls daily. If we have not contacted you within 1 week about your results, please check your mychart to see if there is a message first and if not, then contact our office.  In helping with this matter, you help decrease call volume, and therefore allow [place] to be able to respond to patients needs more efficiently.   Acute office visits (sick visit):  An acute visit is intended for a new  problem and are scheduled in shorter time slots to allow schedule openings for patients with new problems. This is the appropriate visit to discuss a new problem. Problems will not be addressed by phone call or Echart message. Appointment is needed if requesting treatment. In order to provide you with excellent quality medical care with proper time for you to explain your problem, have an exam and receive treatment with instructions, these appointments should be limited to one new problem per  visit. If you experience a new problem, in which you desire to be addressed, please make an acute office visit, we save openings on the schedule to accommodate you. Please do not save your new problem for any other type of visit, let us take care of it properly and quickly for you.   Follow up visits:  Depending on your condition(s) your provider will need to see you routinely in order to provide you with quality care and prescribe medication(s). Most chronic conditions (Example: hypertension, Diabetes, depression/anxiety... etc), require visits a couple times a year. Your provider will instruct you on proper follow up for your personal medical conditions and history. Please make certain to make follow up appointments for your condition as instructed. Failing to do so could result in lapse in your medication treatment/refills. If you request a refill, and are overdue to be seen on a condition, we will always provide you with a 30 day script (once) to allow you time to schedule.    Medicare wellness (well visit): - we have a wonderful Nurse Maudie Mercury), that will meet with you and provide you will yearly medicare wellness visits. These visits should occur yearly (can not be scheduled less than 1 calendar year apart) and cover preventive health, immunizations, advance directives and screenings you are entitled to yearly through your medicare benefits. Do not miss out on your entitled benefits, this is when medicare will pay for these benefits to be ordered for you.  These are strongly encouraged by your provider and is the appropriate type of visit to make certain you are up to date with all preventive health benefits. If you have not had your medicare wellness exam in the last 12 months, please make certain to schedule one by calling the office and schedule your medicare wellness with Maudie Mercury as soon as possible.   Yearly physical (well visit):  - Adults are recommended to be seen yearly for physicals. Check with  your insurance and date of your last physical, most insurances require one calendar year between physicals. Physicals include all preventive health topics, screenings, medical exam and labs that are appropriate for gender/age and history. You may have fasting labs needed at this visit. This is a well visit (not a sick visit), new problems should not be covered during this visit (see acute visit).  - Pediatric patients are seen more frequently when they are younger. Your provider will advise you on well child visit timing that is appropriate for your their age. - This is not a medicare wellness visit. Medicare wellness exams do not have an exam portion to the visit. Some medicare companies allow for a physical, some do not allow a yearly physical. If your medicare allows a yearly physical you can schedule the medicare wellness with our nurse Maudie Mercury and have your physical with your provider after, on the same day. Please check with insurance for your full benefits.   Late Policy/No Shows:  - all new patients should arrive 15-30 minutes earlier than appointment to allow [place]  time  to  obtain all personal demographics,  insurance information and for you to complete office paperwork. - All established patients should arrive 10-15 minutes earlier than appointment time to update all information and be checked in .  - In our best efforts to run on time, if you are late for your appointment you will be asked to either reschedule or if able, we will work you back into the schedule. There will be a wait time to work you back in the schedule,  depending on availability.  - If you are unable to make it to your appointment as scheduled, please call 24 hours ahead of time to allow [place] to fill the time slot with someone else who needs to be seen. If you do not cancel your appointment ahead of time, you may be charged a no show fee.

## 2018-07-08 ENCOUNTER — Ambulatory Visit (INDEPENDENT_AMBULATORY_CARE_PROVIDER_SITE_OTHER): Payer: 59 | Admitting: Family Medicine

## 2018-07-08 ENCOUNTER — Other Ambulatory Visit: Payer: Self-pay

## 2018-07-08 ENCOUNTER — Encounter: Payer: Self-pay | Admitting: Family Medicine

## 2018-07-08 VITALS — Ht 64.0 in

## 2018-07-08 DIAGNOSIS — G43809 Other migraine, not intractable, without status migrainosus: Secondary | ICD-10-CM | POA: Diagnosis not present

## 2018-07-08 MED ORDER — METAXALONE 800 MG PO TABS: 800.0000 mg | ORAL_TABLET | Freq: Three times a day (TID) | ORAL | 2 refills | Status: DC | PRN

## 2018-07-08 NOTE — Progress Notes (Signed)
Virtual Visit via Video   I connected with Carolyn Ball on 07/08/18 at  3:00 PM EDT by a video enabled telemedicine application and verified that I am speaking with the correct person using two identifiers. Location patient: Home Location provider: [facility], Office Persons participating in the virtual visit: Patient, Dr. Raoul Pitch and R.Baker, LPN  I discussed the limitations of evaluation and management by telemedicine and the availability of in person appointments. The patient expressed understanding and agreed to proceed.  Subjective:   Chief Complaint  Patient presents with   Follow-up    F/U for Migraines. Pt states she is still having migraines daily. Pt takes medications and it helps with pain but still has pain.     HPI:  Carolyn Ball is a 58 y.o. female presents today to follow-up on her migraines.  He was seen few weeks ago and started on Imitrex for her migraines.  She states she took an Imitrex followed by another 2 hours later and her headache went from a 10 to a 4 but never quite resolved the next day she then took 1 Imitrex.  She has been taking an Imitrex approximately every other day but her headache has never gone completely away.  She also went and seen a chiropractor during this time.  She states she has been going to this chiropractor for some time.  Endorses mild tightness in the back of her neck and a headache that wraps of her her head to mid forehead. Prior note: Patient presents today by her virtual visit to establish care and discuss her migraines.  She reports that she has about 1-2 migraines a year.  Particular migraine slowly started at the end of last week and has slightly hung on throughout this time.  She has tried NSAIDs for relief and nothing seems to improve her migraines.  At one point in time she was on Topamax but stopped that many years ago.  She does endorse having some sound and light sensitivity with her migraines.  She denies any nausea  and vomiting with her migraines.  She has a history of Mnire's disease and is on a diuretic this has been rather controlled for her.  She also has recently undergone breast cancer treatment with lumpectomy, radiation therapy and she is prescribed anastrozole.  She reports no new medications.  She denies any current ringing in her ears, dizziness, gait instability, difficulty swallowing or slurring her words, or unilateral weakness.  ROS: See pertinent positives and negatives per HPI.  Patient Active Problem List   Diagnosis Date Noted   Meniere disease    Migraine    Hyperlipidemia    Breast cancer of upper-outer quadrant of left female breast ([place]) 02/17/2018    Social History   Tobacco Use   Smoking status: Never Smoker   Smokeless tobacco: Never Used  Substance Use Topics   Alcohol use: Yes    Comment: occasional    Current Outpatient Medications:    anastrozole (ARIMIDEX) 1 MG tablet, Take 1 mg by mouth daily., Disp: , Rfl:    calcium carbonate (OSCAL) 1500 (600 Ca) MG TABS tablet, Take 1 tablet by mouth 2 (two) times daily with a meal. 2am and 2pm, Disp: , Rfl:    chlorthalidone (HYGROTON) 25 MG tablet, Take 1 tablet (25 mg total) by mouth daily., Disp: 30 tablet, Rfl: 5   famotidine (PEPCID) 20 MG tablet, Take 20 mg by mouth daily., Disp: , Rfl:    fexofenadine (  ALLEGRA) 60 MG tablet, Take 60 mg by mouth daily., Disp: , Rfl:    KLOR-CON M10 10 MEQ tablet, Take 20 mEq by mouth daily., Disp: , Rfl: 1   Magnesium 400 MG TABS, Take by mouth., Disp: , Rfl:    NON FORMULARY, Apply 1 application topically as needed. Essential oils- Lemongrass and Tension oil, Disp: , Rfl:    NON FORMULARY, 1 mg. Doterra Lifelong Vitality pack pt takes 2 times daily and is pills and oils, Disp: , Rfl:    Polyethyl Glycol-Propyl Glycol (SYSTANE ULTRA OP), Place 1 drop into both eyes 4 (four) times daily as needed (dry eyes)., Disp: , Rfl:    sodium chloride (OCEAN) 0.65 % SOLN nasal  spray, Place 1 spray into both nostrils daily., Disp: , Rfl:    SUMAtriptan (IMITREX) 50 MG tablet, Take 1 tablet (50 mg total) by mouth every 2 (two) hours as needed for migraine. May repeat in 2 hours if headache persists or recurs., Disp: 10 tablet, Rfl: 0   vitamin B-12 (CYANOCOBALAMIN) 1000 MCG tablet, Take 1,000 mcg by mouth daily., Disp: , Rfl:   Allergies  Allergen Reactions   Adhesive [Tape]    Latex Other (See Comments)    Welts on skin   Shellfish Allergy Other (See Comments)    Intestinal Issues    Objective:  Ht 5\' 4"  (1.626 m)    BMI 24.55 kg/m  Gen: No acute distress. Nontoxic in appearance.  HENT: AT. [place].  MMM.  Eyes:Pupils Equal Round Reactive to light, Extraocular movements intact,  Conjunctiva without redness, discharge or icterus. Chest: Cough shortness of breath not present Skin: No rashes, purpura or petechiae.  Neuro:  Normal gait. Alert. Oriented x3  Psych: Normal affect, dress and demeanor. Normal speech. Normal thought content and judgment.   Assessment and Plan:  Carolyn Ball is a 58 y.o. female present for f/u on migraines.  .Other migraine without status migrainosus, not intractable -Her description today of her headache seems to be more tension and neck focused.  -Heat/ice 20 minutes on 20 minutes off. -Skelaxin 3 times daily as needed -Try to avoid NSAIDs-rebound headache -Massage-stretches discussed.  - could consider steroid If not resolved.  -Follow-up PRN  > 15 minutes spent with patient, >50% of time spent face to face   Howard Pouch, DO 07/08/2018

## 2018-07-09 ENCOUNTER — Encounter: Payer: Self-pay | Admitting: Family Medicine

## 2018-07-09 ENCOUNTER — Telehealth: Payer: Self-pay

## 2018-07-09 MED ORDER — CYCLOBENZAPRINE HCL 5 MG PO TABS: 5.0000 mg | ORAL_TABLET | Freq: Three times a day (TID) | ORAL | 1 refills | Status: AC | PRN

## 2018-07-09 NOTE — Telephone Encounter (Signed)
Called and spoke with patient and she went ahead and paid the money last night to get the Skelaxin. Pt states it did help last night and her pain is down to a 2 so she may pay the money if it does work, if not, switch to the other one. She was going to call pharmacy and see the difference in price and if it is worth it.   Please advise if you would like for me to cancel the new RX for now with pharmacy or keep on file.

## 2018-07-09 NOTE — Addendum Note (Signed)
Addended by: Howard Pouch A on: 07/09/2018 01:39 PM   Modules accepted: Orders

## 2018-07-09 NOTE — Telephone Encounter (Signed)
Copied from [place] [id number]. Topic: General - Other >> Jul 08, 2018  3:51 PM Richardo Priest, [place] wrote: Reason for CRM: Patient called in regards to medication, metaxalone (SKELAXIN) 800 MG tablet, that was prescribed 4/14. States insurance will not pay and would like the other medication that was mentioned in the appointment. Call back number is [phone].   I do not see in your office note where you mention another medication. Please advise.

## 2018-07-09 NOTE — Telephone Encounter (Signed)
Please inform patient I called in cyclobenzaprine/Flexeril 5 mg 3 times daily as needed.  Start at nighttime first, caution with possible sedation. Discontinued Skelaxin.

## 2019-02-10 ENCOUNTER — Other Ambulatory Visit: Payer: Self-pay | Admitting: General Surgery

## 2019-02-10 DIAGNOSIS — Z9889 Other specified postprocedural states: Secondary | ICD-10-CM

## 2019-02-16 ENCOUNTER — Other Ambulatory Visit: Payer: Self-pay | Admitting: General Surgery

## 2019-02-16 ENCOUNTER — Other Ambulatory Visit: Payer: Self-pay

## 2019-02-16 ENCOUNTER — Ambulatory Visit
Admission: RE | Admit: 2019-02-16 | Discharge: 2019-02-16 | Disposition: A | Discharge status: Home / Self Care | Payer: 59 | Source: Ambulatory Visit | Attending: General Surgery | Admitting: General Surgery

## 2019-02-16 DIAGNOSIS — Z9889 Other specified postprocedural states: Secondary | ICD-10-CM

## 2019-02-16 DIAGNOSIS — R921 Mammographic calcification found on diagnostic imaging of breast: Secondary | ICD-10-CM

## 2019-08-18 ENCOUNTER — Ambulatory Visit
Admission: RE | Admit: 2019-08-18 | Discharge: 2019-08-18 | Disposition: A | Discharge status: Home / Self Care | Payer: 59 | Source: Ambulatory Visit | Attending: General Surgery | Admitting: General Surgery

## 2019-08-18 ENCOUNTER — Other Ambulatory Visit: Payer: Self-pay

## 2019-08-18 ENCOUNTER — Other Ambulatory Visit: Payer: Self-pay | Admitting: General Surgery

## 2019-08-18 DIAGNOSIS — R921 Mammographic calcification found on diagnostic imaging of breast: Secondary | ICD-10-CM

## 2019-08-18 DIAGNOSIS — Z9889 Other specified postprocedural states: Secondary | ICD-10-CM

## 2020-01-25 IMAGING — MG DIGITAL DIAGNOSTIC BILAT W/ TOMO W/ CAD
9 of 13 series · 9 of 29 positions shown · non-contrast
Comparison: Previous exam(s).

CLINICAL DATA: History of left breast cancer status post lumpectomy
in 7800.

EXAM:
DIGITAL DIAGNOSTIC BILATERAL MAMMOGRAM WITH CAD AND TOMO

[L CC]
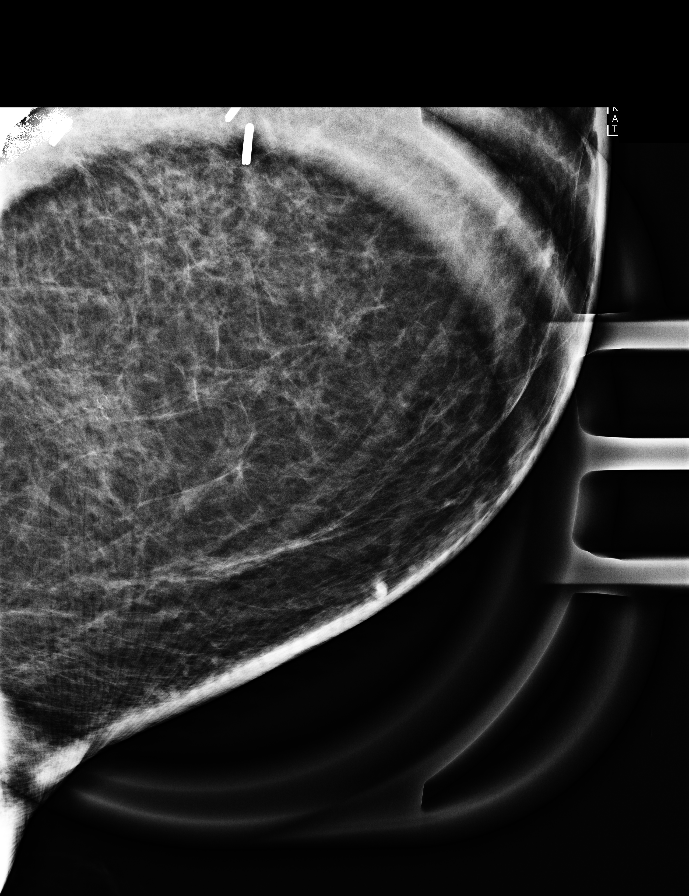

[L ML (1 of 2)]
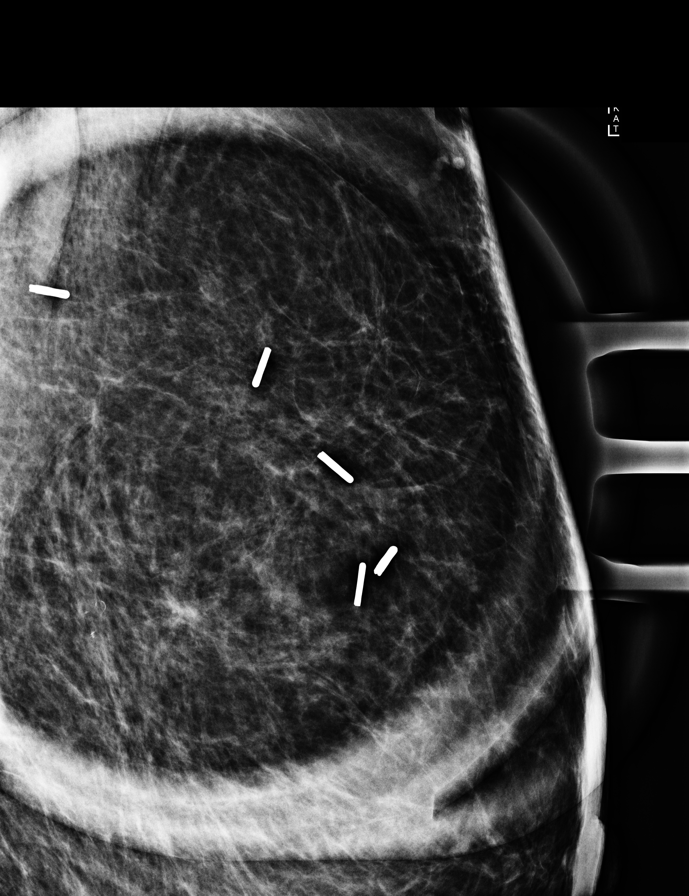

[L MLO (1 of 2)]
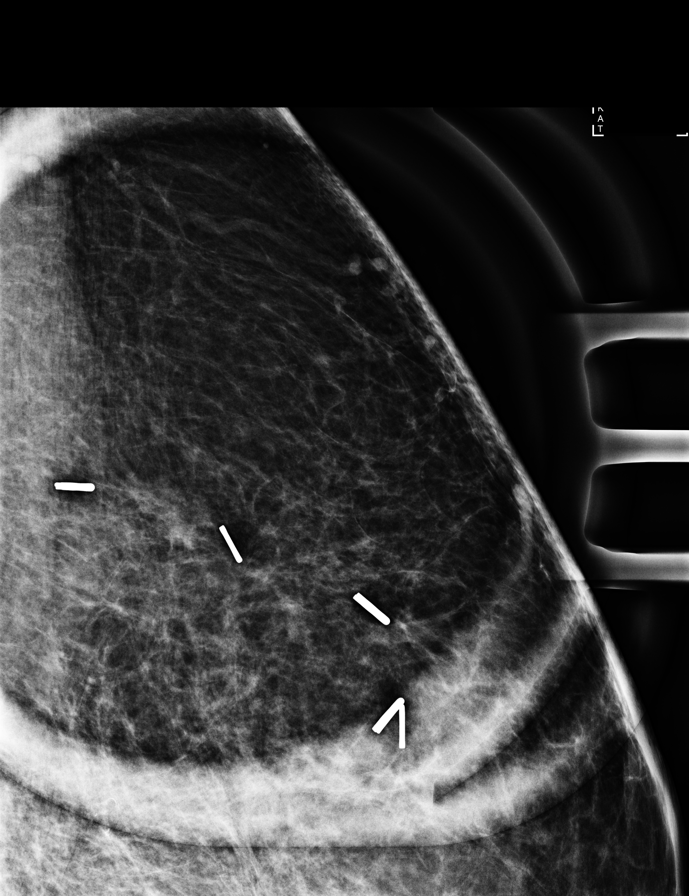

[L MLO (2 of 2)]
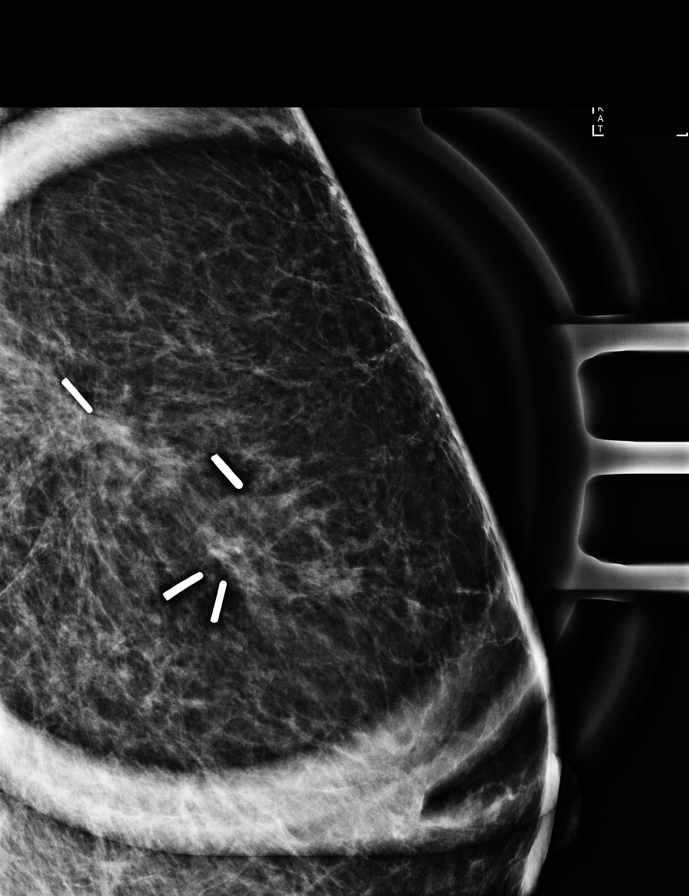

[L ML (2 of 2)]
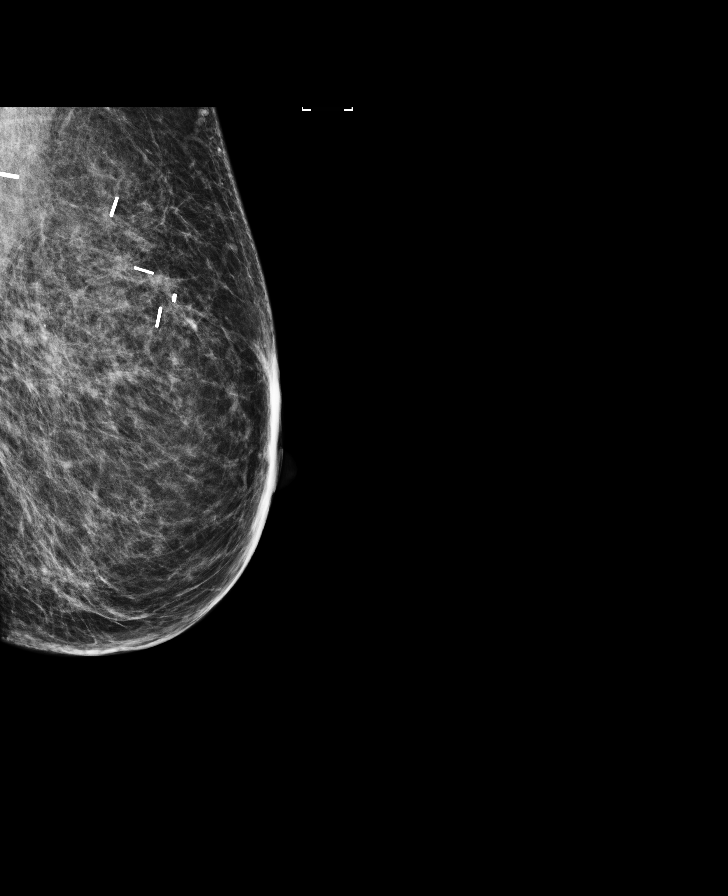

[R MLO synth-2D]
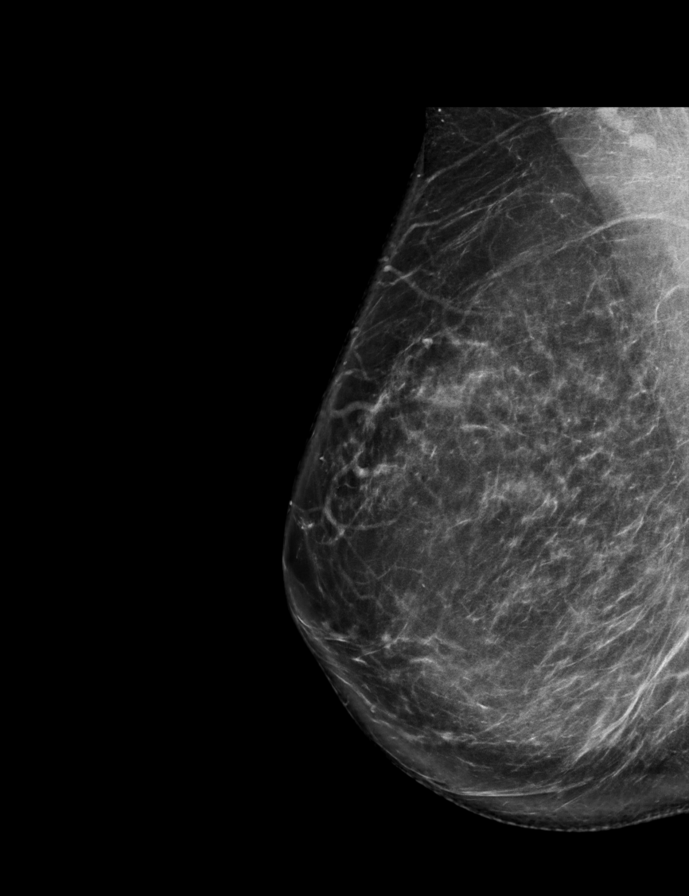

[L MLO synth-2D]
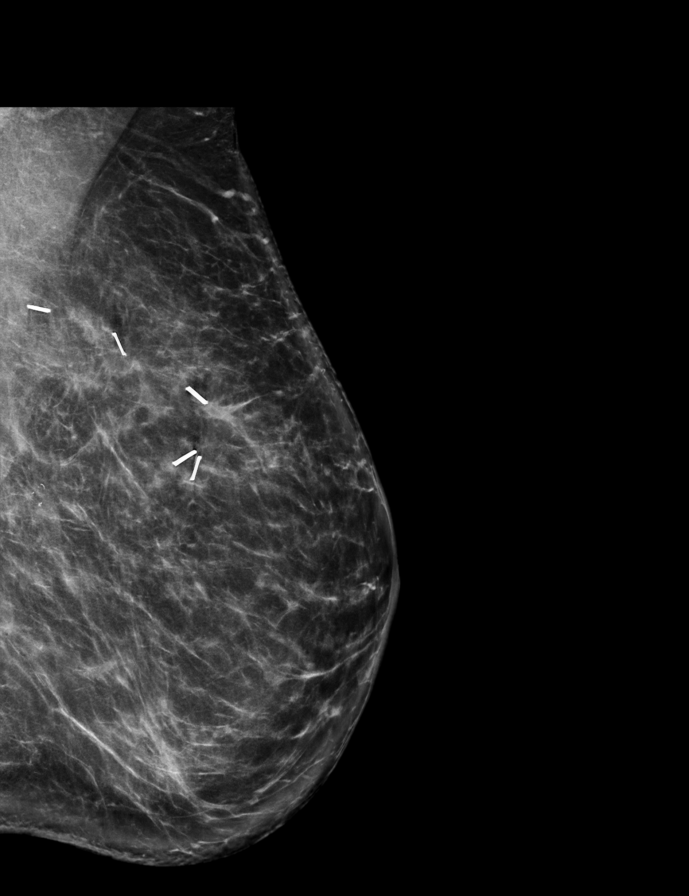

[R CC synth-2D]
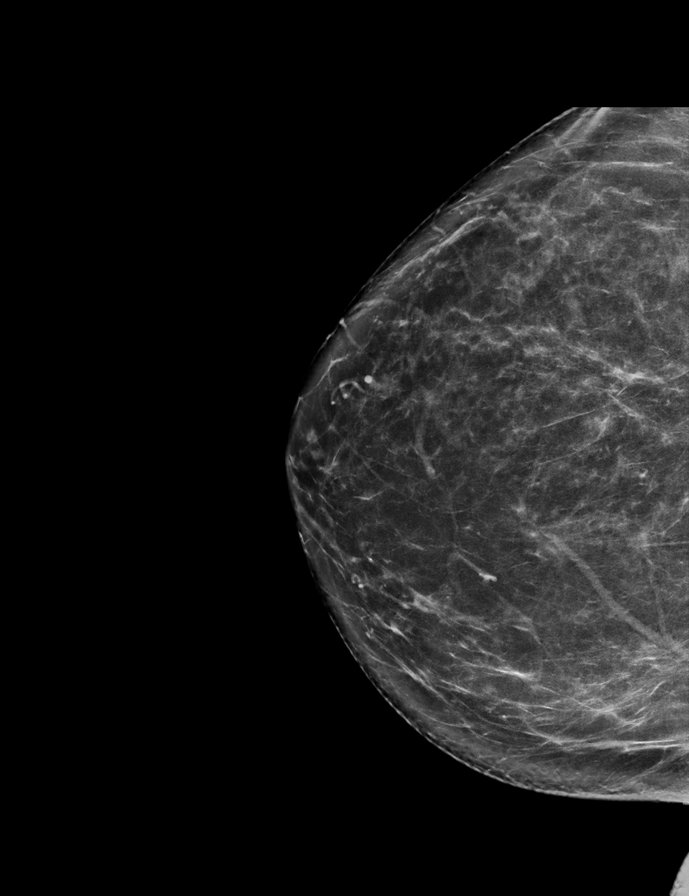

[L CC synth-2D]
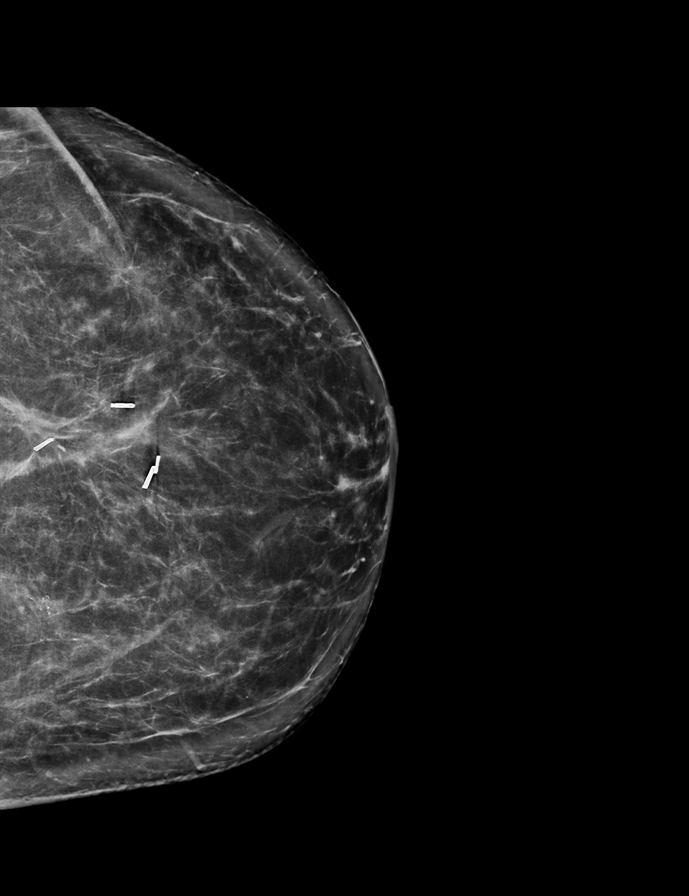

[9 of 29 positions shown; findings below may reference images not displayed]

ACR Breast Density Category b: There are scattered areas of
fibroglandular density.
FINDINGS: No suspicious mass or malignant type microcalcifications identified
in the right breast. Lumpectomy changes are seen in the 12 o'clock
region of the left breast. In the upper inner quadrant of the left
breast are calcifications spanning an area of 4 mm. Calcifications
have a rim like appearance and are felt to be early dystrophic.
There is no suspicious mass.

Mammographic images were processed with CAD.
IMPRESSION: Probable benign calcifications in the left breast.

RECOMMENDATION:
Short-term interval follow-up left mammogram in 6 months is
recommended.

I have discussed the findings and recommendations with the patient.
If applicable, a reminder letter will be sent to the patient
regarding the next appointment.

BI-RADS CATEGORY  3: Probably benign.

## 2020-02-17 ENCOUNTER — Ambulatory Visit
Admission: RE | Admit: 2020-02-17 | Discharge: 2020-02-17 | Disposition: A | Discharge status: Home / Self Care | Payer: 59 | Source: Ambulatory Visit | Attending: General Surgery | Admitting: General Surgery

## 2020-02-17 ENCOUNTER — Other Ambulatory Visit: Payer: Self-pay

## 2020-02-17 DIAGNOSIS — Z9889 Other specified postprocedural states: Secondary | ICD-10-CM

## 2020-02-17 DIAGNOSIS — R921 Mammographic calcification found on diagnostic imaging of breast: Secondary | ICD-10-CM

## 2021-01-25 ENCOUNTER — Other Ambulatory Visit: Payer: Self-pay | Admitting: General Surgery

## 2021-01-25 DIAGNOSIS — R921 Mammographic calcification found on diagnostic imaging of breast: Secondary | ICD-10-CM

## 2021-03-02 ENCOUNTER — Ambulatory Visit
Admission: RE | Admit: 2021-03-02 | Discharge: 2021-03-02 | Disposition: A | Discharge status: Home / Self Care | Payer: 59 | Source: Ambulatory Visit | Attending: General Surgery | Admitting: General Surgery

## 2021-03-02 DIAGNOSIS — R921 Mammographic calcification found on diagnostic imaging of breast: Secondary | ICD-10-CM

## 2022-01-19 ENCOUNTER — Other Ambulatory Visit: Payer: Self-pay | Admitting: General Surgery

## 2022-01-19 DIAGNOSIS — Z1231 Encounter for screening mammogram for malignant neoplasm of breast: Secondary | ICD-10-CM

## 2022-01-30 ENCOUNTER — Ambulatory Visit: Payer: 59 | Admitting: Family Medicine

## 2022-02-13 ENCOUNTER — Ambulatory Visit: Payer: 59 | Admitting: Family Medicine

## 2022-03-13 ENCOUNTER — Ambulatory Visit
Admission: RE | Admit: 2022-03-13 | Discharge: 2022-03-13 | Disposition: A | Discharge status: Home / Self Care | Payer: 59 | Source: Ambulatory Visit | Attending: General Surgery | Admitting: General Surgery

## 2022-03-13 DIAGNOSIS — Z1231 Encounter for screening mammogram for malignant neoplasm of breast: Secondary | ICD-10-CM

## 2023-02-11 ENCOUNTER — Other Ambulatory Visit: Payer: Self-pay | Admitting: Oncology

## 2023-02-11 DIAGNOSIS — Z1231 Encounter for screening mammogram for malignant neoplasm of breast: Secondary | ICD-10-CM

## 2023-04-01 ENCOUNTER — Ambulatory Visit: Payer: 59

## 2023-05-15 ENCOUNTER — Ambulatory Visit: Payer: 59

## 2023-06-11 ENCOUNTER — Ambulatory Visit
Admission: RE | Admit: 2023-06-11 | Discharge: 2023-06-11 | Disposition: A | Discharge status: Home / Self Care | Payer: 59 | Source: Ambulatory Visit | Attending: Oncology | Admitting: Oncology

## 2023-06-11 DIAGNOSIS — Z1231 Encounter for screening mammogram for malignant neoplasm of breast: Secondary | ICD-10-CM
